# Patient Record
Sex: Male | Born: 1995 | Race: Black or African American | Hispanic: No | Marital: Single | Smoking: Never smoker
Health system: Southern US, Community
[De-identification: ages and names within clinical notes are randomized; demographics above are authoritative.]

---

## 2018-07-14 ENCOUNTER — Encounter (HOSPITAL_COMMUNITY): Payer: Self-pay | Admitting: Emergency Medicine

## 2018-07-14 ENCOUNTER — Emergency Department (HOSPITAL_COMMUNITY)
Admission: EM | Admit: 2018-07-14 | Discharge: 2018-07-14 | Disposition: A | Discharge status: Home / Self Care | Payer: BLUE CROSS/BLUE SHIELD | Attending: Emergency Medicine | Admitting: Emergency Medicine

## 2018-07-14 ENCOUNTER — Emergency Department (HOSPITAL_COMMUNITY): Payer: BLUE CROSS/BLUE SHIELD

## 2018-07-14 DIAGNOSIS — W51XXXA Accidental striking against or bumped into by another person, initial encounter: Secondary | ICD-10-CM | POA: Insufficient documentation

## 2018-07-14 DIAGNOSIS — Z79899 Other long term (current) drug therapy: Secondary | ICD-10-CM | POA: Diagnosis not present

## 2018-07-14 DIAGNOSIS — Y9367 Activity, basketball: Secondary | ICD-10-CM | POA: Insufficient documentation

## 2018-07-14 DIAGNOSIS — Y998 Other external cause status: Secondary | ICD-10-CM | POA: Insufficient documentation

## 2018-07-14 DIAGNOSIS — Y929 Unspecified place or not applicable: Secondary | ICD-10-CM | POA: Diagnosis not present

## 2018-07-14 DIAGNOSIS — S82401A Unspecified fracture of shaft of right fibula, initial encounter for closed fracture: Secondary | ICD-10-CM | POA: Insufficient documentation

## 2018-07-14 DIAGNOSIS — S99911A Unspecified injury of right ankle, initial encounter: Secondary | ICD-10-CM | POA: Diagnosis present

## 2018-07-14 MED ORDER — HYDROCODONE-ACETAMINOPHEN 5-325 MG PO TABS: 1.0000 | ORAL_TABLET | Freq: Four times a day (QID) | ORAL | 0 refills | Status: AC | PRN

## 2018-07-14 NOTE — ED Provider Notes (Signed)
[facility]-EMERGENCY DEPT Provider Note   CSN: [id number] Arrival date & time: 07/14/18  1040  History   Chief Complaint Chief Complaint  Patient presents with  . Ankle Pain    HPI Kevin Freeman is a 23 y.o. male with no significant past medical history who presents for evaluation of right ankle pain.  Patient states he was playing basketball yesterday evening when his friends foot slid into patient's right ankle.  Patient states he has felt pain to lateral portion of his right ankle since the incident.  Injury occurred approximately 24 hours PTA.  Patient has taken 400 mg ibuprofen twice daily without relief of pain.  Rates his pain a 5/10.  Pain does not radiate.  Denies numbness or tingling in his extremities, decreased range of motion, fever, chills, proximal tibia or fibular pain.  He states he has been able to walk on his ankle, however it is painful.  Denies additional aggravating or alleviating factors.  History provided by patient.  No interpreter was used.  HPI  History reviewed. No pertinent past medical history.  There are no active problems to display for this patient.   History reviewed. No pertinent surgical history.      Home Medications    Prior to Admission medications   Medication Sig Start Date End Date Taking? Authorizing Provider  ibuprofen (ADVIL,MOTRIN) 200 MG tablet Take 400 mg by mouth every 6 (six) hours as needed for headache or mild pain.    Yes [provider]  HYDROcodone-acetaminophen (NORCO/VICODIN) 5-325 MG tablet Take 1-2 tablets by mouth every 6 (six) hours as needed for up to 3 days for severe pain. 07/14/18 07/17/18  Uzair Godley A, PA-C    Family History No family history on file.  Social History Social History   Tobacco Use  . Smoking status: Not on file  Substance Use Topics  . Alcohol use: Never    Frequency: Never  . Drug use: Never     Allergies   Patient has no known allergies.   Review  of Systems Review of Systems  Constitutional: Negative.   HENT: Negative.   Respiratory: Negative.   Cardiovascular: Negative.   Gastrointestinal: Negative.   Musculoskeletal:       Right ankle pain.  Skin: Negative.   Neurological: Negative.   All other systems reviewed and are negative.    Physical Exam Updated Vital Signs BP (!) 161/77 (BP Location: Right Arm)   Pulse 78   Temp 98.2 F (36.8 C) (Oral)   Resp 16   Ht 6\' 2"  (1.88 m)   Wt 81.6 kg   SpO2 100%   BMI 23.11 kg/m   Physical Exam Vitals signs and nursing note reviewed.  Constitutional:      General: He is not in acute distress.    Appearance: He is well-developed. He is not ill-appearing, toxic-appearing or diaphoretic.  HENT:     Head: Normocephalic and atraumatic.     Nose: Nose normal.     Mouth/Throat:     Mouth: Mucous membranes are moist.     Pharynx: Oropharynx is clear.  Eyes:     Pupils: Pupils are equal, round, and reactive to light.  Neck:     Musculoskeletal: Normal range of motion and neck supple.  Cardiovascular:     Rate and Rhythm: Normal rate and regular rhythm.     Pulses: Normal pulses.     Heart sounds: Normal heart sounds.     Comments: 2+ DP,  PT pulses. Pulmonary:     Effort: Pulmonary effort is normal. No respiratory distress.     Breath sounds: Normal breath sounds.  Abdominal:     General: There is no distension.     Palpations: Abdomen is soft.     Comments: Soft, nontender without rebound or guarding.  Musculoskeletal: Normal range of motion.     Right knee: Normal.     Left knee: Normal.     Right lower leg: He exhibits tenderness. He exhibits no bony tenderness, no swelling, no deformity and no laceration. No edema.     Right foot: Normal.     Left foot: Normal.     Comments: Full range of motion bilateral lower extremities with flexion, dorsiflexion, inversion and eversion.  Patient with tenderness over distal fibula.  Tenderness over proximal fibula or tibia.  No  tenderness to knee with varus or valgus stress.  Lower extremity compartments are soft.  Skin:    General: Skin is warm and dry.     Comments: No edema, erythema, ecchymosis or warmth to bilateral lower extremities.  Neurological:     Mental Status: He is alert.     Comments: Full sensation bilateral lower extremities.  Patient is able to ambulate, however has pain.      ED Treatments / Results  Labs (all labs ordered are listed, but only abnormal results are displayed) Labs Reviewed - No data to display  EKG None  Radiology Dg Ankle Complete Right  Result Date: 07/14/2018 CLINICAL DATA:  Injury of right ankle with pain. EXAM: RIGHT ANKLE - COMPLETE 3+ VIEW COMPARISON:  None. FINDINGS: There is displaced fracture of the distal fibular shaft. No other acute fracture dislocation is identified. IMPRESSION: Fracture distal fibular shaft. Electronically Signed   By: Sherian ReinWei-Chen  Lin M.D.   On: 07/14/2018 11:10    Procedures Procedures (including critical care time)  Medications Ordered in ED Medications - No data to display   Initial Impression / Assessment and Plan / ED Course  I have reviewed the triage vital signs and the nursing notes.  Pertinent labs & imaging results that were available during my care of the patient were reviewed by me and considered in my medical decision making (see chart for details).  23 year old male who appears otherwise well presents for evaluation of right ankle pain.  Onset 24 hours ago after basketball game.  Patient has been able to ambulate, however states it is painful.  Afebrile, nonseptic, non-ill-appearing.  Patient with tenderness over distal fibula.  No tenderness to proximal tibia or fibula or knee.  Normal musculoskeletal exam.  Neurovascularly intact.  Lower extremity compartments are soft.  No erythema, warmth, swelling, contusions or abrasions to lower extremities.  No evidence of compartment syndrome.  X-rays show mildly displaced distal  fibular shaft fracture.  Will place patient in splint and have him follow-up with orthopedics.  Patient has been given crutches and given instructions to be nonweightbearing.  Discussed RICE for symptomatic management.  Pain medicine as indicated.  Discussed not to drive while taking this medication.  Discussed return precautions.  Patient voiced understanding and is agreeable for follow-up.    Final Clinical Impressions(s) / ED Diagnoses   Final diagnoses:  Closed fracture of shaft of right fibula, unspecified fracture morphology, initial encounter    ED Discharge Orders         Ordered    HYDROcodone-acetaminophen (NORCO/VICODIN) 5-325 MG tablet  Every 6 hours PRN     07/14/18 1223  Rivers Hamrick A, PA-C 07/14/18 1306    Charlynne Pander, MD 07/14/18 [phone]

## 2018-07-14 NOTE — Discharge Instructions (Signed)
Your evaluated today for an ankle fracture.  We have placed you in a splint and given you crutches.  Please remain nonweightbearing until you follow-up with orthopedics.  You may ice, rest, elevate the extremity.  You may take ibuprofen as needed for swelling.  I have also given you pain medication.  Please do not drive or operate heavy machinery while taking this medication.  Return to the ED for any worsening symptoms.

## 2018-07-14 NOTE — ED Triage Notes (Signed)
Per pt, states he injured right ankle last night-states increased pain-no obvious deformity or swelling noted at this time

## 2018-07-22 ENCOUNTER — Ambulatory Visit (INDEPENDENT_AMBULATORY_CARE_PROVIDER_SITE_OTHER): Payer: Self-pay

## 2018-07-22 ENCOUNTER — Ambulatory Visit (INDEPENDENT_AMBULATORY_CARE_PROVIDER_SITE_OTHER): Payer: BLUE CROSS/BLUE SHIELD | Admitting: Orthopaedic Surgery

## 2018-07-22 ENCOUNTER — Encounter (INDEPENDENT_AMBULATORY_CARE_PROVIDER_SITE_OTHER): Payer: Self-pay | Admitting: Orthopaedic Surgery

## 2018-07-22 VITALS — BP 148/60 | HR 70 | Ht 74.0 in | Wt 180.0 lb

## 2018-07-22 DIAGNOSIS — M25571 Pain in right ankle and joints of right foot: Secondary | ICD-10-CM

## 2018-07-22 DIAGNOSIS — S8261XA Displaced fracture of lateral malleolus of right fibula, initial encounter for closed fracture: Secondary | ICD-10-CM

## 2018-07-22 NOTE — Progress Notes (Addendum)
Office Visit Note   Patient: Kevin ProwsDamon Murgia           Date of Birth: [birth year]           MRN: [id number] Visit Date: 07/22/2018              Requested by: No referring provider defined for this encounter. PCP: Patient, No Pcp Per   Assessment & Plan: Visit Diagnoses:  1. Pain in right ankle and joints of right foot   2. Displaced fracture of lateral malleolus of right fibula, initial encounter for closed fracture     Plan: Short leg fiberglass cast applied he is nonweightbearing.  Long discussion with patient about importance of compliance and the potential for his injury to include the syndesmosis and potentially reinjury of the deltoid ligament which may cause his ankle to shift.  We went over the x-rays several times he understands importance of being compliant.  We will recheck him in 1 week repeat an x-ray in the cast to make sure he remains satisfactory position. [employer]   Follow-Up Instructions: Return in about 1 week (around 07/29/2018).   Orders:  Orders Placed This Encounter  Procedures  . XR Ankle Complete Right   No orders of the defined types were placed in this encounter.     Procedures: No procedures performed   Clinical Data: No additional findings.   Subjective: Chief Complaint  Patient presents with  . Right Ankle - Fracture    HPI 23 year old male playing basketball [facility] after injury on 07/14/2018.  X-rays demonstrated minimally displaced distal fibular fracture without widening of the medial clear space or syndesmosis.  He is taken the splint off due to pain been noncompliant.  He had some pain in his knee he is noticed swelling in his ankle.  Past history of ankle sprains in the past playing basketball.  Review of Systems 14 point systems noncontributory to HPI.  No hospitalizations or serious illnesses.   Objective: Vital Signs: BP (!) 148/60   Pulse 70   Ht 6\' 2"  (1.88 m)   Wt 180 lb (81.6 kg)   BMI 23.11 kg/m   Physical  Exam Constitutional:      Appearance: He is well-developed.  HENT:     Head: Normocephalic and atraumatic.  Eyes:     Pupils: Pupils are equal, round, and reactive to light.  Neck:     Thyroid: No thyromegaly.     Trachea: No tracheal deviation.  Cardiovascular:     Rate and Rhythm: Normal rate.  Pulmonary:     Effort: Pulmonary effort is normal.     Breath sounds: No wheezing.  Abdominal:     General: Bowel sounds are normal.     Palpations: Abdomen is soft.  Skin:    General: Skin is warm and dry.     Capillary Refill: Capillary refill takes less than 2 seconds.  Neurological:     Mental Status: He is alert and oriented to person, place, and time.  Psychiatric:        Behavior: Behavior normal.        Thought Content: Thought content normal.        Judgment: Judgment normal.     Ortho Exam tenderness over the fibular fracture distal third shaft.  He does not have any tenderness or swelling over the syndesmosis there is no acute swelling over the deltoid ligament.  Specialty Comments:  No specialty comments available.  Imaging: No results found.  PMFS History: There are no active problems to display for this patient.  History reviewed. No pertinent past medical history.  History reviewed. No pertinent family history.  History reviewed. No pertinent surgical history. Social History   Occupational History  . Not on file  Tobacco Use  . Smoking status: Never Smoker  . Smokeless tobacco: Never Used  Substance and Sexual Activity  . Alcohol use: Never    Frequency: Never  . Drug use: Never  . Sexual activity: Not on file

## 2018-07-23 ENCOUNTER — Ambulatory Visit (INDEPENDENT_AMBULATORY_CARE_PROVIDER_SITE_OTHER): Payer: BLUE CROSS/BLUE SHIELD

## 2018-07-29 ENCOUNTER — Encounter (INDEPENDENT_AMBULATORY_CARE_PROVIDER_SITE_OTHER): Payer: Self-pay | Admitting: Orthopaedic Surgery

## 2018-07-29 ENCOUNTER — Ambulatory Visit (INDEPENDENT_AMBULATORY_CARE_PROVIDER_SITE_OTHER): Payer: Self-pay

## 2018-07-29 ENCOUNTER — Ambulatory Visit (INDEPENDENT_AMBULATORY_CARE_PROVIDER_SITE_OTHER): Payer: BLUE CROSS/BLUE SHIELD | Admitting: Orthopaedic Surgery

## 2018-07-29 VITALS — BP 110/72 | HR 72 | Ht 74.0 in | Wt 180.0 lb

## 2018-07-29 DIAGNOSIS — S8261XG Displaced fracture of lateral malleolus of right fibula, subsequent encounter for closed fracture with delayed healing: Secondary | ICD-10-CM

## 2018-07-29 DIAGNOSIS — M25571 Pain in right ankle and joints of right foot: Secondary | ICD-10-CM

## 2018-07-29 DIAGNOSIS — Z9119 Patient's noncompliance with other medical treatment and regimen: Secondary | ICD-10-CM

## 2018-07-29 DIAGNOSIS — Z91199 Patient's noncompliance with other medical treatment and regimen due to unspecified reason: Secondary | ICD-10-CM

## 2018-07-29 NOTE — Progress Notes (Signed)
Office Visit Note   Patient: Kevin Freeman           Date of Birth: [birth year]           MRN: [id number] Visit Date: 07/29/2018              Requested by: No referring provider defined for this encounter. PCP: Patient, No Pcp Per   Assessment & Plan: Visit Diagnoses:  1. Pain in right ankle and joints of right foot   2.     Noncompliant, patient removed his own cast yesterday.  Plan: Short leg cast applied.  He will return in 2 weeks for repeat x-rays in his cast.  He will be nonweightbearing we discussed the importance of following instructions so he ends up with a good ankle.  We discussed that if the fibula shortens or the medial clear space widens then he would require plate fixation of the lateral malleolus fibular fracture.  Follow-Up Instructions: No follow-ups on file.   Orders:  Orders Placed This Encounter  Procedures  . XR Ankle Complete Right   No orders of the defined types were placed in this encounter.     Procedures: No procedures performed   Clinical Data: No additional findings.   Subjective: Chief Complaint  Patient presents with  . Right Ankle - Follow-up    HPI 23 year old male returns post right ankle fracture.  He was in a cast and was here for recheck to make sure the mortise was not shifting.  Patient is noncompliant and took his cast off yesterday with a pocket knife.  He states the cast felt like it was bothering him some over his metatarsal heads.  He did not have a problem with the cast being too tight but just felt like it was bothering him some.  He came in for nurse visit only the day after I saw him on 129/20 and had taken that cast off so this is a second time is been noncompliant.  X-ray shows 1 to 2 mm of shifting the fibula possible 1 mm shortening no widening of the medial clear space.  We discussed the importance of compliance so that he ends up with the ankle that is properly healed in good position.  Review of Systems updated and  unchanged.   Objective: Vital Signs: BP 110/72 (BP Location: Left Arm, Patient Position: Sitting, Cuff Size: Normal)   Pulse 72   Ht 6\' 2"  (1.88 m)   Wt 180 lb (81.6 kg)   BMI 23.11 kg/m   Physical Exam Constitutional:      Appearance: He is well-developed.  HENT:     Head: Normocephalic and atraumatic.  Eyes:     Pupils: Pupils are equal, round, and reactive to light.  Neck:     Thyroid: No thyromegaly.     Trachea: No tracheal deviation.  Cardiovascular:     Rate and Rhythm: Normal rate.  Pulmonary:     Effort: Pulmonary effort is normal.     Breath sounds: No wheezing.  Abdominal:     General: Bowel sounds are normal.     Palpations: Abdomen is soft.  Skin:    General: Skin is warm and dry.     Capillary Refill: Capillary refill takes less than 2 seconds.  Neurological:     Mental Status: He is alert and oriented to person, place, and time.  Psychiatric:        Behavior: Behavior normal.        Thought  Content: Thought content normal.        Judgment: Judgment normal.     Ortho Exam patient has no medial deltoid ligament tenderness.  Tender over the fibular fracture.  No ecchymosis or swelling over the syndesmosis.  Specialty Comments:  No specialty comments available.  Imaging: No results found.   PMFS History: There are no active problems to display for this patient.  History reviewed. No pertinent past medical history.  History reviewed. No pertinent family history.  History reviewed. No pertinent surgical history. Social History   Occupational History  . Not on file  Tobacco Use  . Smoking status: Never Smoker  . Smokeless tobacco: Never Used  Substance and Sexual Activity  . Alcohol use: Never    Frequency: Never  . Drug use: Never  . Sexual activity: Not on file

## 2018-08-14 ENCOUNTER — Ambulatory Visit (INDEPENDENT_AMBULATORY_CARE_PROVIDER_SITE_OTHER): Payer: BLUE CROSS/BLUE SHIELD

## 2018-08-14 ENCOUNTER — Encounter (INDEPENDENT_AMBULATORY_CARE_PROVIDER_SITE_OTHER): Payer: Self-pay | Admitting: Orthopaedic Surgery

## 2018-08-14 ENCOUNTER — Ambulatory Visit (INDEPENDENT_AMBULATORY_CARE_PROVIDER_SITE_OTHER): Payer: BLUE CROSS/BLUE SHIELD | Admitting: Orthopaedic Surgery

## 2018-08-14 VITALS — BP 147/82 | HR 67 | Ht 74.0 in | Wt 180.0 lb

## 2018-08-14 DIAGNOSIS — S8261XA Displaced fracture of lateral malleolus of right fibula, initial encounter for closed fracture: Secondary | ICD-10-CM

## 2018-08-14 NOTE — Progress Notes (Signed)
   Post-Op Visit Note   Patient: Kevin Freeman           Date of Birth: 30-Sep-1995           MRN: [id number] Visit Date: 08/14/2018 PCP: Patient, No Pcp Per   Assessment & Plan: Continue cast return 3 weeks for cast off x-rays out of cast and probable cam boot application.  Chief Complaint:  Chief Complaint  Patient presents with  . Right Ankle - Fracture, Follow-up   Visit Diagnoses:  1. Displaced fracture of lateral malleolus of right fibula, initial encounter for closed fracture     Plan: Return in 3 weeks cast off x-rays out of cast and likely cam boot.  Follow-Up Instructions: No follow-ups on file.   Orders:  Orders Placed This Encounter  Procedures  . XR Ankle Complete Right   No orders of the defined types were placed in this encounter.   Imaging: Xr Ankle Complete Right  Result Date: 08/14/2018 Three-view x-rays right ankle: Obtained and reviewed.  AP lateral and mortise view.  Fibular fracture is in satisfactory position with slight healing.  No widening of the medial clear space. Impression: Lateral malleolar fracture with good position of the ankle mortise.   PMFS History: There are no active problems to display for this patient.  No past medical history on file.  No family history on file.  No past surgical history on file. Social History   Occupational History  . Not on file  Tobacco Use  . Smoking status: Never Smoker  . Smokeless tobacco: Never Used  Substance and Sexual Activity  . Alcohol use: Never    Frequency: Never  . Drug use: Never  . Sexual activity: Not on file

## 2018-09-04 ENCOUNTER — Ambulatory Visit (INDEPENDENT_AMBULATORY_CARE_PROVIDER_SITE_OTHER): Payer: BLUE CROSS/BLUE SHIELD

## 2018-09-04 ENCOUNTER — Other Ambulatory Visit: Payer: Self-pay

## 2018-09-04 ENCOUNTER — Encounter (INDEPENDENT_AMBULATORY_CARE_PROVIDER_SITE_OTHER): Payer: Self-pay | Admitting: Orthopaedic Surgery

## 2018-09-04 ENCOUNTER — Ambulatory Visit (INDEPENDENT_AMBULATORY_CARE_PROVIDER_SITE_OTHER): Payer: BLUE CROSS/BLUE SHIELD | Admitting: Orthopaedic Surgery

## 2018-09-04 VITALS — BP 123/79 | HR 66 | Ht 74.0 in | Wt 180.0 lb

## 2018-09-04 DIAGNOSIS — S8261XA Displaced fracture of lateral malleolus of right fibula, initial encounter for closed fracture: Secondary | ICD-10-CM

## 2018-09-04 NOTE — Progress Notes (Signed)
   Post-Op Visit Note   Patient: Kevin Freeman           Date of Birth: [birth year]           MRN: [id number] Visit Date: 09/04/2018 PCP: Patient, No Pcp Per   Assessment & Plan: Post right distal fibular fracture.  X-rays demonstrate interval healing and his mortise remains reduced no tenderness at the fracture site and is radiographically bridging bone.  Cast off cam boot application and office follow-up as needed.  He will gradually work his way out of the cam boot into his tennis shoe.  Chief Complaint:  Chief Complaint  Patient presents with  . Right Ankle - Follow-up, Fracture    DOI 07/13/2018   Visit Diagnoses:  1. Displaced fracture of lateral malleolus of right fibula, initial encounter for closed fracture     Plan: Cam boot application is gradually work as well as a Designer, multimedia into a tennis shoe.  Office follow-up as needed.  Follow-Up Instructions: No follow-ups on file.   Orders:  Orders Placed This Encounter  Procedures  . XR Ankle Complete Right   No orders of the defined types were placed in this encounter.   Imaging: No results found.  PMFS History: There are no active problems to display for this patient.  No past medical history on file.  No family history on file.  No past surgical history on file. Social History   Occupational History  . Not on file  Tobacco Use  . Smoking status: Never Smoker  . Smokeless tobacco: Never Used  Substance and Sexual Activity  . Alcohol use: Never    Frequency: Never  . Drug use: Never  . Sexual activity: Not on file

## 2019-06-26 IMAGING — CR DG ANKLE COMPLETE 3+V*R*
3 series · 3 of 3 positions shown · non-contrast
Comparison: None.

CLINICAL DATA: Injury of right ankle with pain.

EXAM:
RIGHT ANKLE - COMPLETE 3+ VIEW

[t ankle ap right]
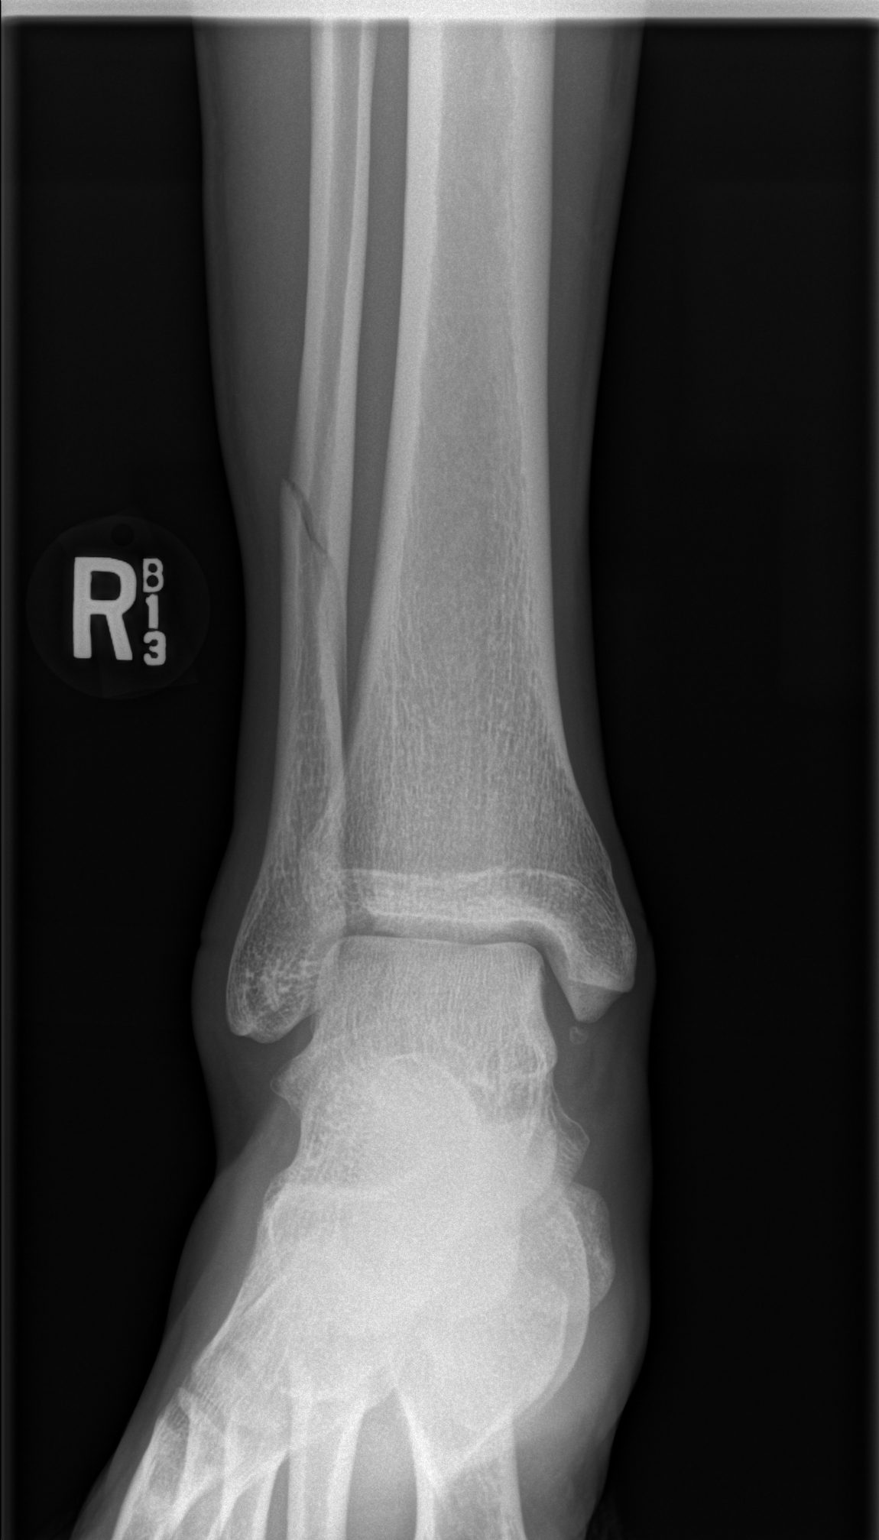

[t ankle obl right]
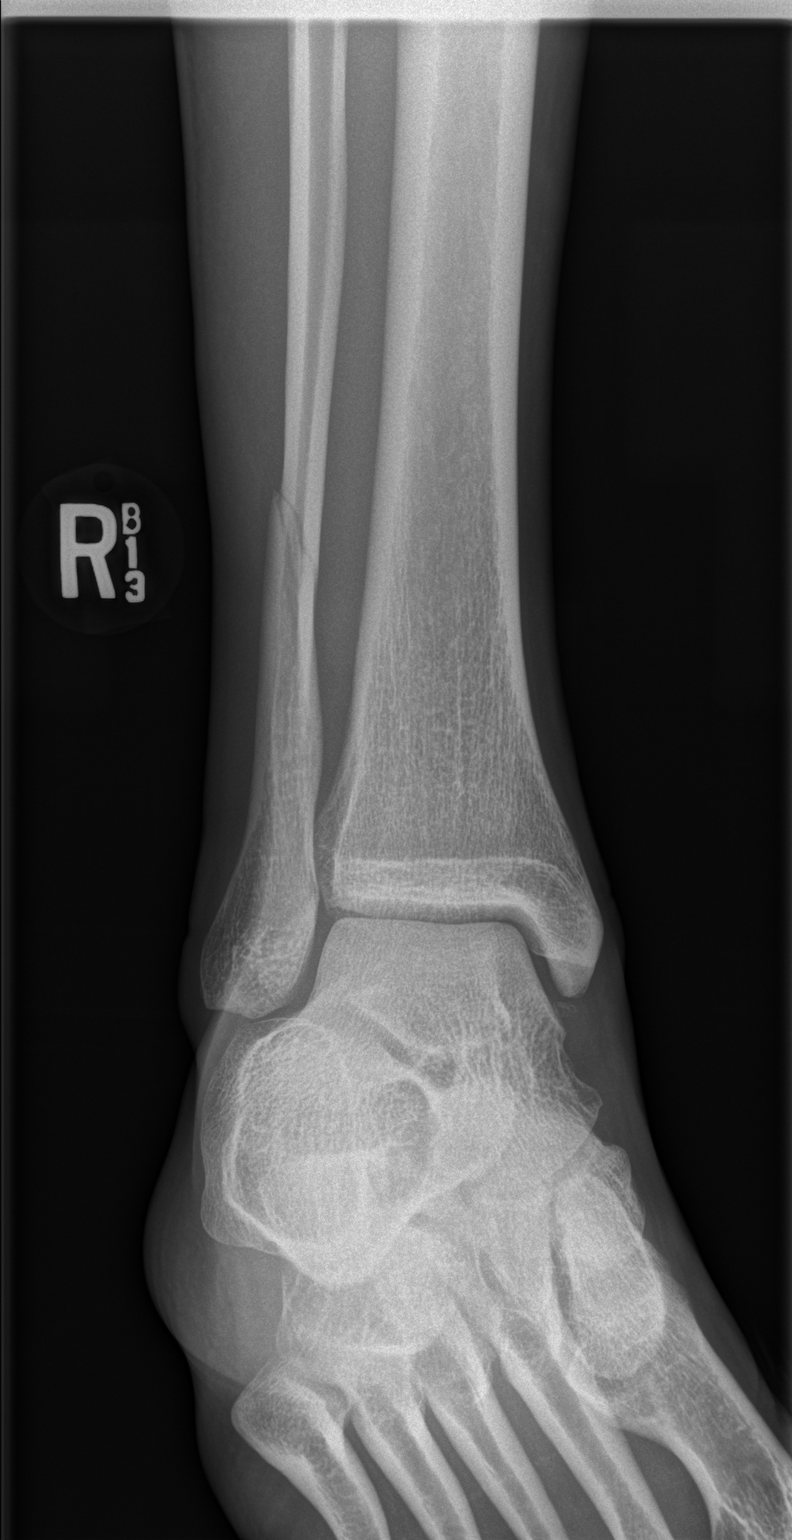

[t ankle lat right]
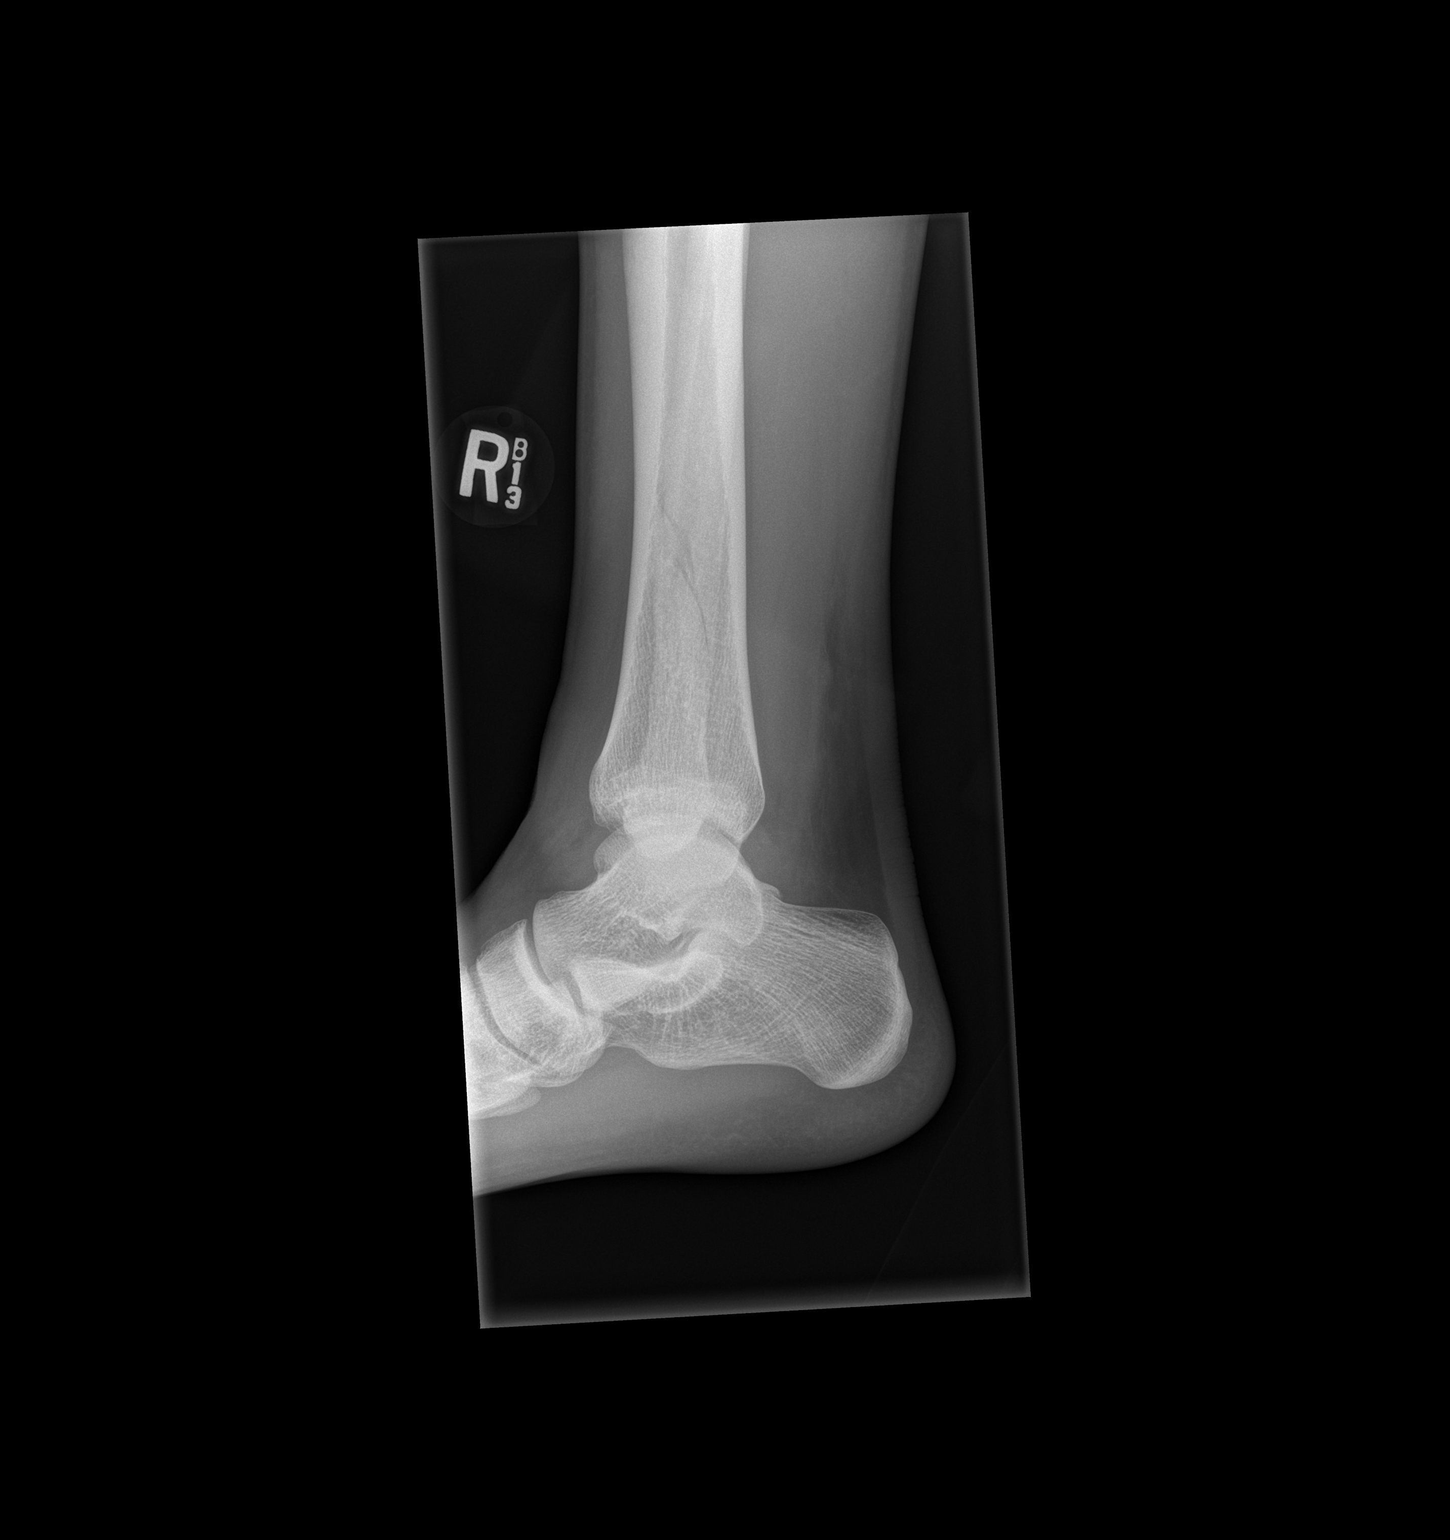

[3 of 3 positions shown; findings below may reference images not displayed]

FINDINGS: There is displaced fracture of the distal fibular shaft. No other
acute fracture dislocation is identified.
IMPRESSION: Fracture distal fibular shaft.
